# Patient Record
Sex: Female | Born: 1965 | Race: White | Hispanic: Yes | Marital: Married | Smoking: Never smoker
Health system: Southern US, Community
[De-identification: ages and names within clinical notes are randomized; demographics above are authoritative.]

---

## 2001-03-11 ENCOUNTER — Encounter: Payer: Self-pay | Admitting: Emergency Medicine

## 2001-03-11 ENCOUNTER — Emergency Department (HOSPITAL_COMMUNITY): Admission: AC | Admit: 2001-03-11 | Discharge: 2001-03-11 | Payer: Self-pay

## 2001-04-04 ENCOUNTER — Emergency Department (HOSPITAL_COMMUNITY): Admission: EM | Admit: 2001-04-04 | Discharge: 2001-04-04 | Payer: Self-pay | Admitting: Emergency Medicine

## 2001-06-09 ENCOUNTER — Emergency Department (HOSPITAL_COMMUNITY): Admission: EM | Admit: 2001-06-09 | Discharge: 2001-06-09 | Payer: Self-pay

## 2001-07-06 ENCOUNTER — Emergency Department (HOSPITAL_COMMUNITY): Admission: EM | Admit: 2001-07-06 | Discharge: 2001-07-07 | Payer: Self-pay | Admitting: Emergency Medicine

## 2001-10-20 ENCOUNTER — Emergency Department (HOSPITAL_COMMUNITY): Admission: EM | Admit: 2001-10-20 | Discharge: 2001-10-20 | Payer: Self-pay | Admitting: *Deleted

## 2001-10-20 ENCOUNTER — Encounter: Payer: Self-pay | Admitting: *Deleted

## 2002-12-06 ENCOUNTER — Emergency Department (HOSPITAL_COMMUNITY): Admission: EM | Admit: 2002-12-06 | Discharge: 2002-12-06 | Payer: Self-pay | Admitting: Emergency Medicine

## 2003-03-04 ENCOUNTER — Emergency Department (HOSPITAL_COMMUNITY): Admission: EM | Admit: 2003-03-04 | Discharge: 2003-03-04 | Payer: Self-pay | Admitting: Emergency Medicine

## 2004-09-08 ENCOUNTER — Emergency Department (HOSPITAL_COMMUNITY): Admission: EM | Admit: 2004-09-08 | Discharge: 2004-09-08 | Payer: Self-pay | Admitting: *Deleted

## 2005-07-28 ENCOUNTER — Emergency Department (HOSPITAL_COMMUNITY): Admission: EM | Admit: 2005-07-28 | Discharge: 2005-07-28 | Payer: Self-pay | Admitting: Emergency Medicine

## 2007-04-08 ENCOUNTER — Observation Stay (HOSPITAL_COMMUNITY): Admission: EM | Admit: 2007-04-08 | Discharge: 2007-04-09 | Payer: Self-pay | Admitting: Emergency Medicine

## 2007-06-14 ENCOUNTER — Other Ambulatory Visit: Admission: RE | Admit: 2007-06-14 | Discharge: 2007-06-14 | Payer: Self-pay | Admitting: Obstetrics and Gynecology

## 2007-07-04 ENCOUNTER — Emergency Department (HOSPITAL_COMMUNITY): Admission: EM | Admit: 2007-07-04 | Discharge: 2007-07-04 | Payer: Self-pay | Admitting: Emergency Medicine

## 2007-09-01 ENCOUNTER — Inpatient Hospital Stay (HOSPITAL_COMMUNITY): Admission: AD | Admit: 2007-09-01 | Discharge: 2007-09-01 | Payer: Self-pay | Admitting: Family Medicine

## 2007-09-26 ENCOUNTER — Ambulatory Visit: Payer: Self-pay | Admitting: Obstetrics and Gynecology

## 2007-09-26 ENCOUNTER — Inpatient Hospital Stay (HOSPITAL_COMMUNITY): Admission: AD | Admit: 2007-09-26 | Discharge: 2007-09-26 | Payer: Self-pay | Admitting: Obstetrics & Gynecology

## 2007-10-09 ENCOUNTER — Ambulatory Visit: Payer: Self-pay | Admitting: Obstetrics & Gynecology

## 2007-10-09 ENCOUNTER — Inpatient Hospital Stay (HOSPITAL_COMMUNITY): Admission: AD | Admit: 2007-10-09 | Discharge: 2007-10-11 | Payer: Self-pay | Admitting: Obstetrics and Gynecology

## 2009-05-06 ENCOUNTER — Emergency Department (HOSPITAL_COMMUNITY): Admission: EM | Admit: 2009-05-06 | Discharge: 2009-05-06 | Payer: Self-pay | Admitting: Emergency Medicine

## 2009-08-31 ENCOUNTER — Emergency Department (HOSPITAL_COMMUNITY): Admission: EM | Admit: 2009-08-31 | Discharge: 2009-08-31 | Payer: Self-pay | Admitting: Emergency Medicine

## 2009-12-19 ENCOUNTER — Ambulatory Visit (HOSPITAL_COMMUNITY): Admission: RE | Admit: 2009-12-19 | Discharge: 2009-12-19 | Payer: Self-pay | Admitting: Family Medicine

## 2010-06-11 LAB — POCT I-STAT, CHEM 8
BUN: 15 mg/dL (ref 6–23)
Calcium, Ion: 1.06 mmol/L — ABNORMAL LOW (ref 1.12–1.32)
Chloride: 110 mEq/L (ref 96–112)
Creatinine, Ser: 0.5 mg/dL (ref 0.4–1.2)
Glucose, Bld: 108 mg/dL — ABNORMAL HIGH (ref 70–99)
HCT: 28 % — ABNORMAL LOW (ref 36.0–46.0)
Hemoglobin: 9.5 g/dL — ABNORMAL LOW (ref 12.0–15.0)
Potassium: 3.7 mEq/L (ref 3.5–5.1)
Sodium: 137 mEq/L (ref 135–145)
TCO2: 19 mmol/L (ref 0–100)

## 2010-06-28 ENCOUNTER — Emergency Department (HOSPITAL_COMMUNITY): Payer: Self-pay

## 2010-06-28 ENCOUNTER — Encounter (HOSPITAL_COMMUNITY): Payer: Self-pay

## 2010-06-28 ENCOUNTER — Emergency Department (HOSPITAL_COMMUNITY)
Admission: EM | Admit: 2010-06-28 | Discharge: 2010-06-28 | Disposition: A | Discharge status: Home / Self Care | Payer: Self-pay | Attending: Emergency Medicine | Admitting: Emergency Medicine

## 2010-06-28 DIAGNOSIS — IMO0001 Reserved for inherently not codable concepts without codable children: Secondary | ICD-10-CM | POA: Insufficient documentation

## 2010-06-28 DIAGNOSIS — J984 Other disorders of lung: Secondary | ICD-10-CM | POA: Insufficient documentation

## 2010-06-28 DIAGNOSIS — R059 Cough, unspecified: Secondary | ICD-10-CM | POA: Insufficient documentation

## 2010-06-28 DIAGNOSIS — E860 Dehydration: Secondary | ICD-10-CM | POA: Insufficient documentation

## 2010-06-28 DIAGNOSIS — M549 Dorsalgia, unspecified: Secondary | ICD-10-CM | POA: Insufficient documentation

## 2010-06-28 DIAGNOSIS — R51 Headache: Secondary | ICD-10-CM | POA: Insufficient documentation

## 2010-06-28 DIAGNOSIS — B9789 Other viral agents as the cause of diseases classified elsewhere: Secondary | ICD-10-CM | POA: Insufficient documentation

## 2010-06-28 DIAGNOSIS — R05 Cough: Secondary | ICD-10-CM | POA: Insufficient documentation

## 2010-06-28 DIAGNOSIS — R110 Nausea: Secondary | ICD-10-CM | POA: Insufficient documentation

## 2010-06-28 LAB — URINALYSIS, ROUTINE W REFLEX MICROSCOPIC
Glucose, UA: NEGATIVE mg/dL
Hgb urine dipstick: NEGATIVE
Ketones, ur: 15 mg/dL — AB
Nitrite: NEGATIVE
Protein, ur: NEGATIVE mg/dL
Specific Gravity, Urine: 1.025 (ref 1.005–1.030)
Urobilinogen, UA: 1 mg/dL (ref 0.0–1.0)
pH: 5.5 (ref 5.0–8.0)

## 2010-06-28 LAB — CBC
HCT: 36.8 % (ref 36.0–46.0)
Hemoglobin: 11.9 g/dL — ABNORMAL LOW (ref 12.0–15.0)
MCH: 29 pg (ref 26.0–34.0)
MCHC: 32.3 g/dL (ref 30.0–36.0)
MCV: 89.8 fL (ref 78.0–100.0)
Platelets: 332 10*3/uL (ref 150–400)
RBC: 4.1 MIL/uL (ref 3.87–5.11)
RDW: 15.1 % (ref 11.5–15.5)
WBC: 10.4 10*3/uL (ref 4.0–10.5)

## 2010-06-28 LAB — COMPREHENSIVE METABOLIC PANEL
ALT: 84 U/L — ABNORMAL HIGH (ref 0–35)
AST: 35 U/L (ref 0–37)
Albumin: 3.4 g/dL — ABNORMAL LOW (ref 3.5–5.2)
Alkaline Phosphatase: 234 U/L — ABNORMAL HIGH (ref 39–117)
BUN: 10 mg/dL (ref 6–23)
CO2: 21 mEq/L (ref 19–32)
Calcium: 8.7 mg/dL (ref 8.4–10.5)
Chloride: 106 mEq/L (ref 96–112)
Creatinine, Ser: 0.5 mg/dL (ref 0.4–1.2)
GFR calc Af Amer: >60 mL/min (ref >60)
GFR calc non Af Amer: >60 mL/min (ref >60)
Glucose, Bld: 103 mg/dL — ABNORMAL HIGH (ref 70–99)
Potassium: 3.8 mEq/L (ref 3.5–5.1)
Sodium: 136 mEq/L (ref 135–145)
Total Bilirubin: 0.5 mg/dL (ref 0.3–1.2)
Total Protein: 7.3 g/dL (ref 6.0–8.3)

## 2010-06-28 LAB — DIFFERENTIAL
Basophils Absolute: 0 10*3/uL (ref 0.0–0.1)
Basophils Relative: 0 % (ref 0–1)
Eosinophils Absolute: 0.2 10*3/uL (ref 0.0–0.7)
Eosinophils Relative: 2 % (ref 0–5)
Lymphocytes Relative: 19 % (ref 12–46)
Lymphs Abs: 2 10*3/uL (ref 0.7–4.0)
Monocytes Absolute: 0.7 10*3/uL (ref 0.1–1.0)
Monocytes Relative: 6 % (ref 3–12)
Neutro Abs: 7.6 10*3/uL (ref 1.7–7.7)
Neutrophils Relative %: 73 % (ref 43–77)

## 2010-06-28 LAB — URINE MICROSCOPIC-ADD ON

## 2010-06-28 LAB — POCT CARDIAC MARKERS
CKMB, poc: <1 ng/mL — ABNORMAL LOW (ref 1.0–8.0)
Myoglobin, poc: 39.6 ng/mL (ref 12–200)
Troponin i, poc: <0.05 ng/mL (ref 0.00–0.09)

## 2010-06-28 LAB — POCT PREGNANCY, URINE: Preg Test, Ur: NEGATIVE

## 2010-06-28 MED ORDER — IOHEXOL 300 MG/ML  SOLN
80.0000 mL | Freq: Once | INTRAMUSCULAR | Status: AC | PRN
  Administered 2010-06-28: 80 mL via INTRAVENOUS

## 2010-06-30 LAB — URINE CULTURE
Colony Count: NO GROWTH
Culture  Setup Time: 201204090927
Culture: NO GROWTH

## 2010-08-04 NOTE — H&P (Signed)
NAMENEWELL, FRATER               ACCOUNT NO.:  [id number]   MEDICAL RECORD NO.:  [id number]          PATIENT TYPE:  OBV   LOCATION:  A338                          FACILITY:  [facility]   PHYSICIAN:  Tilford Pillar, MD      DATE OF BIRTH:  1965-07-28   DATE OF ADMISSION:  04/07/2007  DATE OF DISCHARGE:  LH                              HISTORY & PHYSICAL   CHIEF COMPLAINT:  Abdominal pain.   HISTORY OF PRESENT ILLNESS:  The patient is a 45 year old Hispanic  female who is approximately 17 weeks gravid who presents with  approximately 2 days of right-sided abdominal pain.  She is Spanish  speaking but does speak some English, however, during obtaining the  history and physical the phone interpreter patient service was utilized.  Apparently this abdominal pain came on insidiously.  Has always been  right-sided with no radiation.  She has had some associated nausea with  it, only a couple episodes of emesis.  These have improved.  She has had  no hematemesis.  She states that the pain was worse with walking and no  revealing features were noted.  She has denied any change with bowel  movements.  No hematochezia, no melena.  She has had some dysuria with  foul-smelling urine but no hematuria.  She has had no similar pain in  the past although during the conversation she has stated that her pain  symptoms have improved significantly since her admission.  She denies  any current fever or chills.  No shortness of breath.  No chest pain.  She denies any vaginal discharge.  No vaginal bleeding.  It is noted  that her last OB/GYN appointment was in July.  She has not seen an OB  for any prenatal or perinatal care.   PAST MEDICAL HISTORY:  None.   PAST SURGICAL HISTORY:  None.   CURRENT MEDICATIONS:  No current medications.   ALLERGIES:  No known drug allergies.   SOCIAL HISTORY:  No tobacco use, no alcohol use, no recreational drug  use.   REVIEW OF SYSTEMS:  CONSTITUTIONAL:  Unremarkable.   EYES:  Unremarkable.  EARS, NOSE AND THROAT:  Unremarkable.  PULMONARY:  Unremarkable.  CARDIOVASCULAR:  Unremarkable.  GASTROINTESTINAL:  As per HPI, otherwise  unremarkable.  GENITOURINARY:  The dysuria and foul-smelling urine per  HPI, otherwise unremarkable.  ENDOCRINE:  Unremarkable.  MUSCULOSKELETAL:  Unremarkable.   PHYSICAL EXAMINATION:  VITAL SIGNS:  Temperature 99.3, heart rate 83,  respirations 18, blood pressure 104/59.  GENERAL:  She is lying in a supine position in her hospital bed in no  acute distress.  Family is present in the room.  HEENT:  Pupils are equal, round and reactive.  Extraocular movements are  intact.  No conjunctival pallor is noted.  Trachea is midline.  No  cervical lymphadenopathy is appreciated.  PULMONARY:  Unlabored respirations.  She is clear to auscultation  bilaterally.  CARDIOVASCULAR:  Regular rate and rhythm.  No murmurs or gallops are  appreciated.  ABDOMEN:  She does have bowel sounds present.  Abdomen is soft and  gravid on palpation.  She does have right-sided mild to moderate  tenderness to palpation.  No rebound or peritoneal signs are elicited.  No herniations are apparent.  EXTREMITIES:  Warm and dry.   PERTINENT LABORATORY AND RADIOGRAPHIC STUDIES:  CBC, white blood cell  count 13.6 down from 15.0 in the emergency department.  Hemoglobin 9.7,  hematocrit 34.0, platelets 228.  Basic metabolic panel, sodium 135,  potassium 3.6, chloride 110, bicarb 21, BUN 5, creatinine 0.43, glucose  101.  UA obtained in the emergency department is negative.  No blood.  No white blood cells, no bacteria.  CT evaluation of the abdomen and  pelvis, no free air or free fluid is demonstrated.  She does have a  slightly widened appendix measuring 9 mm which is slightly increased  from normal caliber.  No stranding or inflammatory changes are noted in  the pericecal region.   ASSESSMENT AND PLAN:  Abdominal pain.  At this point suspicion is low  for  acute appendicitis although this is still a possibility but early  appendicitis.  This was discussed with the patient.  Also noted is the  patient does have an appetite and feels hungry which is not consistent  with an acute appendicitis.  Her white blood cell count has decreased  without any antibiotic treatment, with just IV fluid hydration, and  would be in a consistent with a normal pregnancy.  Additionally she has  not had any fevers.  She is not tachycardic.  Her abdominal pain has  improved, again with just IV fluid hydration.  She is not currently on  any pain medications and will remain off of pain medications so that any  change in her symptomatology can be detected early.  This was discussed  with the patient as well as the continuation of an n.p.o. status.  The  risk of appendicitis in the presence of a pregnancy as well as the risks  of having an operation in the second trimester of her pregnancy were  discussed with the patient. At this point should any of her  symptomatology or blood work or clinical presentation worsen suggesting  progression of appendicitis then a surgical intervention will be  pursued.  However, at this point continued IV fluid hydration and close  monitoring will be conducted.  Additionally, no matter which direction  the patient's course may proceed it is recommended that she get  obstetric care for her pregnancy.      Tilford Pillar, MD  Electronically Signed     BZ/MEDQ  D:  04/08/2007  T:  04/08/2007  Job:  [id number]

## 2010-12-10 LAB — URINALYSIS, ROUTINE W REFLEX MICROSCOPIC
Bilirubin Urine: NEGATIVE
Glucose, UA: NEGATIVE
Hgb urine dipstick: NEGATIVE
Ketones, ur: NEGATIVE
Nitrite: NEGATIVE
Protein, ur: NEGATIVE
Specific Gravity, Urine: <1.005 — ABNORMAL LOW
Urobilinogen, UA: 0.2
pH: 6

## 2010-12-10 LAB — CBC
HCT: 30 — ABNORMAL LOW
HCT: 30.8 — ABNORMAL LOW
Hemoglobin: 10.1 — ABNORMAL LOW
Hemoglobin: 9.7 — ABNORMAL LOW
Hemoglobin: 9.9 — ABNORMAL LOW
MCHC: 32.1
MCHC: 32.4
MCV: 75.1 — ABNORMAL LOW
MCV: 75.4 — ABNORMAL LOW
Platelets: 228
Platelets: 242
Platelets: 248
RBC: 3.98
RBC: 4.09
RDW: 19.7 — ABNORMAL HIGH
RDW: 19.8 — ABNORMAL HIGH
RDW: 20.5 — ABNORMAL HIGH
WBC: 13.6 — ABNORMAL HIGH
WBC: 15 — ABNORMAL HIGH
WBC: 9.4

## 2010-12-10 LAB — DIFFERENTIAL
Basophils Absolute: 0
Basophils Absolute: 0
Basophils Absolute: 0.1
Basophils Relative: 0
Basophils Relative: 0
Eosinophils Absolute: 0
Eosinophils Absolute: 0.1
Eosinophils Relative: 0
Eosinophils Relative: 1
Lymphocytes Relative: 16
Lymphocytes Relative: 17
Lymphocytes Relative: 23
Lymphs Abs: 2.2
Lymphs Abs: 2.2
Lymphs Abs: 2.5
Monocytes Absolute: 0.6
Monocytes Absolute: 0.6
Monocytes Absolute: 0.8
Monocytes Relative: 5
Monocytes Relative: 6
Neutro Abs: 10.7 — ABNORMAL HIGH
Neutro Abs: 11.5 — ABNORMAL HIGH
Neutro Abs: 6.6
Neutrophils Relative %: 77
Neutrophils Relative %: 79 — ABNORMAL HIGH

## 2010-12-10 LAB — BASIC METABOLIC PANEL
BUN: 5 — ABNORMAL LOW
CO2: 21
Calcium: 8.3 — ABNORMAL LOW
Chloride: 110
Creatinine, Ser: 0.43
GFR calc Af Amer: >60
GFR calc non Af Amer: >60
Glucose, Bld: 101 — ABNORMAL HIGH
Potassium: 3.6
Sodium: 135

## 2010-12-15 LAB — DIFFERENTIAL
Eosinophils Absolute: 0.3
Eosinophils Relative: 3
Lymphs Abs: 2.6
Monocytes Absolute: 0.9
Monocytes Relative: 8

## 2010-12-15 LAB — URINALYSIS, ROUTINE W REFLEX MICROSCOPIC
Bilirubin Urine: NEGATIVE
Glucose, UA: NEGATIVE
Ketones, ur: NEGATIVE
Protein, ur: NEGATIVE

## 2010-12-15 LAB — BASIC METABOLIC PANEL
CO2: 23
Chloride: 107
GFR calc Af Amer: >60
Potassium: 3.9

## 2010-12-15 LAB — CBC
HCT: 34 — ABNORMAL LOW
MCV: 90.5
RBC: 3.76 — ABNORMAL LOW
WBC: 11.3 — ABNORMAL HIGH

## 2010-12-15 LAB — URINE MICROSCOPIC-ADD ON

## 2010-12-18 LAB — CBC
HCT: 36.2
MCV: 93.6
RBC: 3.86 — ABNORMAL LOW
WBC: 11.1 — ABNORMAL HIGH

## 2010-12-18 LAB — RPR: RPR Ser Ql: NONREACTIVE

## 2011-04-15 ENCOUNTER — Encounter (HOSPITAL_COMMUNITY): Payer: Self-pay | Admitting: *Deleted

## 2011-04-15 ENCOUNTER — Emergency Department (HOSPITAL_COMMUNITY)
Admission: EM | Admit: 2011-04-15 | Discharge: 2011-04-15 | Disposition: A | Discharge status: Home / Self Care | Payer: Self-pay | Attending: Emergency Medicine | Admitting: Emergency Medicine

## 2011-04-15 DIAGNOSIS — R21 Rash and other nonspecific skin eruption: Secondary | ICD-10-CM | POA: Insufficient documentation

## 2011-04-15 DIAGNOSIS — R1012 Left upper quadrant pain: Secondary | ICD-10-CM | POA: Insufficient documentation

## 2011-04-15 DIAGNOSIS — L52 Erythema nodosum: Secondary | ICD-10-CM | POA: Insufficient documentation

## 2011-04-15 LAB — COMPREHENSIVE METABOLIC PANEL
ALT: 18 U/L (ref 0–35)
Alkaline Phosphatase: 79 U/L (ref 39–117)
CO2: 26 mEq/L (ref 19–32)
Chloride: 104 mEq/L (ref 96–112)
GFR calc Af Amer: >90 mL/min (ref >90)
GFR calc non Af Amer: >90 mL/min (ref >90)
Glucose, Bld: 103 mg/dL — ABNORMAL HIGH (ref 70–99)
Potassium: 4.6 mEq/L (ref 3.5–5.1)
Sodium: 138 mEq/L (ref 135–145)

## 2011-04-15 LAB — DIFFERENTIAL
Lymphocytes Relative: 25 % (ref 12–46)
Lymphs Abs: 2.1 10*3/uL (ref 0.7–4.0)
Neutro Abs: 5.7 10*3/uL (ref 1.7–7.7)
Neutrophils Relative %: 65 % (ref 43–77)

## 2011-04-15 LAB — CBC
MCV: 83.1 fL (ref 78.0–100.0)
Platelets: 283 10*3/uL (ref 150–400)
RBC: 4.14 MIL/uL (ref 3.87–5.11)
WBC: 8.8 10*3/uL (ref 4.0–10.5)

## 2011-04-15 LAB — URINALYSIS, ROUTINE W REFLEX MICROSCOPIC
Hgb urine dipstick: NEGATIVE
Specific Gravity, Urine: 1.019 (ref 1.005–1.030)
Urobilinogen, UA: 0.2 mg/dL (ref 0.0–1.0)

## 2011-04-15 MED ORDER — DIPHENHYDRAMINE HCL 50 MG/ML IJ SOLN
25.0000 mg | Freq: Once | INTRAMUSCULAR | Status: AC
  Administered 2011-04-15: 25 mg via INTRAVENOUS
  Filled 2011-04-15: qty 1

## 2011-04-15 MED ORDER — PREDNISONE 20 MG PO TABS
60.0000 mg | ORAL_TABLET | ORAL | Status: AC
  Administered 2011-04-15: 60 mg via ORAL
  Filled 2011-04-15: qty 3

## 2011-04-15 MED ORDER — SODIUM CHLORIDE 0.9 % IV BOLUS (SEPSIS)
1000.0000 mL | Freq: Once | INTRAVENOUS | Status: AC
  Administered 2011-04-15: 1000 mL via INTRAVENOUS

## 2011-04-15 NOTE — ED Provider Notes (Signed)
History     CSN: [id number]  Arrival date & time 04/15/11  1712   First MD Initiated Contact with Patient 04/15/11 1814      Chief Complaint  Patient presents with  . Rash  . Abdominal Pain    (Consider location/radiation/quality/duration/timing/severity/associated sxs/prior treatment) HPI The patient presents with rash and left sided abdominal pain.  She notes that the rash began gradually, 3 days ago.  Since onset it has expanded to encompass both lower extremities in her entirety.  The rash is patchy, erythematous, not painful, pruritic.  No recollection of particular precipitants, no alleviating or exacerbating factors.  Yesterday the patient also developed abdominal pain.  The pain is focally about the left lateral abdomen.  Pain is nonradiating, sharp.  No clear exacerbating or alleviating factors for the pain either.  The patient denies any significant: Current nausea, vomiting, diarrhea, dysuria, other abdominal pain, chest pain, dyspnea. History reviewed. No pertinent past medical history.  History reviewed. No pertinent past surgical history.  No family history on file.  History  Substance Use Topics  . Smoking status: Never Smoker   . Smokeless tobacco: Not on file  . Alcohol Use: No    OB History    Grav Para Term Preterm Abortions TAB SAB Ect Mult Living                  Review of Systems  Constitutional:       HPI  HENT:       HPI otherwise negative  Eyes: Negative.   Respiratory:       HPI, otherwise negative  Cardiovascular:       HPI, otherwise nmegative  Gastrointestinal: Negative for vomiting.  Genitourinary:       HPI, otherwise negative  Musculoskeletal:       HPI, otherwise negative  Skin: Positive for rash.  Neurological: Negative for syncope.    Allergies  Review of patient's allergies indicates no known allergies.  Home Medications  No current outpatient prescriptions on file.  BP 111/64  Pulse 83  Temp(Src) 98.5 F (36.9 C)  (Oral)  Resp 16  SpO2 97%  Physical Exam  Nursing note and vitals reviewed. Constitutional: She is oriented to person, place, and time. She appears well-developed and well-nourished. No distress.  HENT:  Head: Normocephalic and atraumatic.  Eyes: Conjunctivae and EOM are normal. Pupils are equal, round, and reactive to light.  Cardiovascular: Normal rate and regular rhythm.   Pulmonary/Chest: Effort normal. No stridor. She has no wheezes. She has no rales.  Abdominal: Soft. There is no hepatosplenomegaly. There is tenderness in the left upper quadrant. There is no CVA tenderness. No hernia.  Musculoskeletal: She exhibits no edema and no tenderness.  Neurological: She is alert and oriented to person, place, and time. No cranial nerve deficit. Coordination normal.  Skin: She is not diaphoretic.       Diffuse minimally raised erythematous patches consistent with erythema nodosum across both lower extremities .    ED Course  Procedures (including critical care time)  Labs Reviewed  CBC - Abnormal; Notable for the following:    Hemoglobin 11.2 (*)    HCT 34.4 (*)    RDW 16.1 (*)    All other components within normal limits  COMPREHENSIVE METABOLIC PANEL - Abnormal; Notable for the following:    Glucose, Bld 103 (*)    All other components within normal limits  URINALYSIS, ROUTINE W REFLEX MICROSCOPIC  DIFFERENTIAL  POCT PREGNANCY, URINE  POCT  PREGNANCY, URINE   No results found.   No diagnosis found.    MDM  This previously well 46 year old female now presents with several days of rash and new abdominal pain.  On exam she is in no distress.  She has a rash consistent with erythema nodosum.  Given the patient's nutrition abdominal pain there is some concern for ongoing GI infection, though the patient's absence of distress, diarrhea, vomiting is reassuring.  Similarly reassuring is the patient's absence of fever, continued by mouth tolerance and the absence of leukocytosis.  The  patient we discharged in stable condition to follow up with her primary care physician.        Gerhard Munch, MD 04/15/11 2035

## 2011-04-15 NOTE — ED Notes (Signed)
Patient complains of rash and itching x 3 days. Rates pain as 8/10 for itching and discomfort.

## 2011-04-15 NOTE — ED Notes (Signed)
Patient ambulatory to bathroom several times without difficulty.  Denies itching and discomfort

## 2011-04-15 NOTE — ED Notes (Signed)
Pt is here for evaluation of red rash and Left sided abdominal pain.  Pt has had the rash for 3 days and the abdominal pain for 2 days

## 2011-04-15 NOTE — ED Notes (Signed)
Patient left ambulatory with family. Denies pain, itching or further concerns.  Understanding of d/c instruction

## 2012-05-15 ENCOUNTER — Encounter (HOSPITAL_COMMUNITY): Payer: Self-pay | Admitting: *Deleted

## 2012-05-15 ENCOUNTER — Emergency Department (HOSPITAL_COMMUNITY)
Admission: EM | Admit: 2012-05-15 | Discharge: 2012-05-16 | Disposition: A | Discharge status: Home / Self Care | Payer: Self-pay | Attending: Emergency Medicine | Admitting: Emergency Medicine

## 2012-05-15 DIAGNOSIS — Z3202 Encounter for pregnancy test, result negative: Secondary | ICD-10-CM | POA: Insufficient documentation

## 2012-05-15 DIAGNOSIS — R300 Dysuria: Secondary | ICD-10-CM | POA: Insufficient documentation

## 2012-05-15 DIAGNOSIS — Z79899 Other long term (current) drug therapy: Secondary | ICD-10-CM | POA: Insufficient documentation

## 2012-05-15 DIAGNOSIS — N390 Urinary tract infection, site not specified: Secondary | ICD-10-CM | POA: Insufficient documentation

## 2012-05-15 DIAGNOSIS — R109 Unspecified abdominal pain: Secondary | ICD-10-CM | POA: Insufficient documentation

## 2012-05-15 LAB — WET PREP, GENITAL: Trich, Wet Prep: NONE SEEN

## 2012-05-15 NOTE — ED Provider Notes (Signed)
History     CSN: [id number]  Arrival date & time 05/15/12  2156   First MD Initiated Contact with Patient 05/15/12 2230      Chief Complaint  Patient presents with  . Vaginal Itching    (Consider location/radiation/quality/duration/timing/severity/associated sxs/prior treatment) HPI Comments: Patient is a 47 year old female who presents with a 2 day history of vaginal itching. Patient reports symptoms started gradually and progressively worsened since the onset. Patient reports associated dysuria. LMP last week. Symptoms worse with urination. Patient has not tried anything for symptoms. No alleviating factors.   Patient is a 46 y.o. female presenting with vaginal itching.  Vaginal Itching    History reviewed. No pertinent past medical history.  History reviewed. No pertinent past surgical history.  History reviewed. No pertinent family history.  History  Substance Use Topics  . Smoking status: Never Smoker   . Smokeless tobacco: Not on file  . Alcohol Use: No    OB History   Grav Para Term Preterm Abortions TAB SAB Ect Mult Living                  Review of Systems  Genitourinary:       Vaginal itching  All other systems reviewed and are negative.    Allergies  Review of patient's allergies indicates no known allergies.  Home Medications   Current Outpatient Rx  Name  Route  Sig  Dispense  Refill  . acetaminophen (TYLENOL) 500 MG tablet   Oral   Take 1,000 mg by mouth every 6 (six) hours as needed for pain.         . cetirizine (ZYRTEC) 10 MG tablet   Oral   Take 10 mg by mouth daily.         Marland Kitchen ibuprofen (ADVIL,MOTRIN) 200 MG tablet   Oral   Take 400 mg by mouth every 6 (six) hours as needed for pain.           BP 120/52  Pulse 81  Temp(Src) 98.2 F (36.8 C) (Oral)  Resp 18  SpO2 100%  LMP 05/08/2012  Physical Exam  Nursing note and vitals reviewed. Constitutional: She is oriented to person, place, and time. She appears  well-developed and well-nourished. No distress.  HENT:  Head: Normocephalic and atraumatic.  Eyes: Conjunctivae and EOM are normal.  Neck: Normal range of motion.  Cardiovascular: Normal rate and regular rhythm.  Exam reveals no gallop and no friction rub.   No murmur heard. Pulmonary/Chest: Effort normal and breath sounds normal. She has no wheezes. She has no rales. She exhibits no tenderness.  Abdominal: Soft. She exhibits no distension. There is tenderness. There is no rebound and no guarding.  Mild lower abdominal tenderness. No peritoneal signs.   Genitourinary:  Thick, white discharge noted. No CMT. No abnormal masses or adnexal tenderness noted on bimanual exam.   Musculoskeletal: Normal range of motion.  Neurological: She is alert and oriented to person, place, and time. Coordination normal.  Speech is goal-oriented. Moves limbs without ataxia.   Skin: Skin is warm and dry.  Psychiatric: She has a normal mood and affect. Her behavior is normal.    ED Course  Procedures (including critical care time)  Labs Reviewed  WET PREP, GENITAL - Abnormal; Notable for the following:    Clue Cells Wet Prep HPF POC FEW (*)    WBC, Wet Prep HPF POC MANY (*)    All other components within normal limits  URINALYSIS, ROUTINE  W REFLEX MICROSCOPIC - Abnormal; Notable for the following:    APPearance TURBID (*)    Protein, ur 100 (*)    Leukocytes, UA LARGE (*)    All other components within normal limits  URINE MICROSCOPIC-ADD ON - Abnormal; Notable for the following:    Squamous Epithelial / LPF FEW (*)    Bacteria, UA FEW (*)    All other components within normal limits  GC/CHLAMYDIA PROBE AMP  URINE CULTURE  POCT PREGNANCY, URINE   No results found.   1. UTI (lower urinary tract infection)       MDM  10:49 PM Patient will have pelvic. Urinalysis pending.   12:02 AM Urinalysis not sent to main lab. Patient signed out to [facility] [place] for disposition.         Emilia Beck, [place] 05/16/12 [phone]

## 2012-05-15 NOTE — ED Notes (Signed)
Pt c/o vaginal itching and pain when urinating.  No vaginal bleeding.  Denies fever, n/v.

## 2012-05-16 LAB — URINALYSIS, ROUTINE W REFLEX MICROSCOPIC
Bilirubin Urine: NEGATIVE
Glucose, UA: NEGATIVE mg/dL
Hgb urine dipstick: NEGATIVE
Ketones, ur: NEGATIVE mg/dL
Nitrite: NEGATIVE
Protein, ur: 100 mg/dL — AB
Specific Gravity, Urine: 1.025 (ref 1.005–1.030)
Urobilinogen, UA: 1 mg/dL (ref 0.0–1.0)
pH: 7 (ref 5.0–8.0)

## 2012-05-16 LAB — URINE MICROSCOPIC-ADD ON

## 2012-05-16 MED ORDER — NAPROXEN 500 MG PO TABS: 500.0000 mg | ORAL_TABLET | Freq: Two times a day (BID) | ORAL | Status: DC

## 2012-05-16 MED ORDER — DEXTROSE 5 % IV SOLN
1.0000 g | Freq: Once | INTRAVENOUS | Status: AC
  Administered 2012-05-16: 1 g via INTRAVENOUS
  Filled 2012-05-16: qty 10

## 2012-05-16 MED ORDER — KETOROLAC TROMETHAMINE 30 MG/ML IJ SOLN
30.0000 mg | Freq: Once | INTRAMUSCULAR | Status: AC
  Administered 2012-05-16: 30 mg via INTRAVENOUS
  Filled 2012-05-16: qty 1

## 2012-05-16 MED ORDER — SODIUM CHLORIDE 0.9 % IV BOLUS (SEPSIS)
1000.0000 mL | Freq: Once | INTRAVENOUS | Status: AC
  Administered 2012-05-16: 1000 mL via INTRAVENOUS

## 2012-05-16 MED ORDER — CEPHALEXIN 500 MG PO CAPS: 500.0000 mg | ORAL_CAPSULE | Freq: Two times a day (BID) | ORAL | Status: DC

## 2012-05-16 NOTE — ED Provider Notes (Signed)
Pt care resumed from [facility]. Pt to ER c/o vaginal itching & dysuria. Pt is sexually active with her husband and does not use protection. Pelvic exam with thick white dc, but no cervical motion tenderness. Wet Prep w Many WBCs & few clue cells. Pt has GC/Chlamydia cultures pending and disposition pending UA. Pt re-evaluated & on exam: hemodynamically stable, NAD, heart w/ RRR, lungs CTAB, Chest & abd non-tender, no peripheral edema or calf tenderness.  12:47 AM  Pt w TNTC WBCs, large leukocytes and proteinuria. Will tx w IVFs, rocephin, and Toradol. Pt to be dc with keflex. Vaginal cultures pending. Pt has been diagnosed with a UTI. Pt is afebrile, no CVA tenderness, normotensive, and denies N/V. Pt to be dc home with antibiotics and instructions to follow up with PCP if symptoms persist.  At this time there does not appear to be any evidence of an acute emergency medical condition and the patient appears stable for discharge with appropriate outpatient follow up.Diagnosis was discussed with patient who verbalizes understanding and is agreeable to discharge.      Jaci Carrel, [place] 05/16/12 [phone]

## 2012-05-16 NOTE — ED Provider Notes (Signed)
  Medical screening examination/treatment/procedure(s) were performed by non-physician practitioner and as supervising physician I was immediately available for consultation/collaboration.    Clinton Dragone, MD 05/16/12 2321 

## 2012-05-16 NOTE — ED Provider Notes (Signed)
  Medical screening examination/treatment/procedure(s) were performed by non-physician practitioner and as supervising physician I was immediately available for consultation/collaboration.    Gerhard Munch, MD 05/16/12 [phone]

## 2012-05-17 LAB — URINE CULTURE: Colony Count: 9000

## 2012-05-17 LAB — GC/CHLAMYDIA PROBE AMP
CT Probe RNA: NEGATIVE
GC Probe RNA: NEGATIVE

## 2012-10-04 ENCOUNTER — Other Ambulatory Visit (HOSPITAL_COMMUNITY): Payer: Self-pay | Admitting: Nurse Practitioner

## 2012-10-04 DIAGNOSIS — Z139 Encounter for screening, unspecified: Secondary | ICD-10-CM

## 2012-10-11 ENCOUNTER — Ambulatory Visit (HOSPITAL_COMMUNITY)
Admission: RE | Admit: 2012-10-11 | Discharge: 2012-10-11 | Disposition: A | Discharge status: Home / Self Care | Payer: Self-pay | Source: Ambulatory Visit | Attending: Nurse Practitioner | Admitting: Nurse Practitioner

## 2012-10-11 DIAGNOSIS — Z139 Encounter for screening, unspecified: Secondary | ICD-10-CM

## 2013-09-25 ENCOUNTER — Emergency Department (HOSPITAL_COMMUNITY)
Admission: EM | Admit: 2013-09-25 | Discharge: 2013-09-25 | Disposition: A | Discharge status: Home / Self Care | Payer: Self-pay | Attending: Emergency Medicine | Admitting: Emergency Medicine

## 2013-09-25 ENCOUNTER — Encounter (HOSPITAL_COMMUNITY): Payer: Self-pay | Admitting: Emergency Medicine

## 2013-09-25 DIAGNOSIS — M26609 Unspecified temporomandibular joint disorder, unspecified side: Secondary | ICD-10-CM | POA: Insufficient documentation

## 2013-09-25 MED ORDER — METHOCARBAMOL 500 MG PO TABS: 1000.0000 mg | ORAL_TABLET | Freq: Four times a day (QID) | ORAL | Status: AC | PRN

## 2013-09-25 NOTE — ED Provider Notes (Signed)
CSN: [id number]     Arrival date & time 09/25/13  1737 History  This chart was scribed for non-physician practitioner, Wynetta EmeryNicole Julliana Whitmyer, PA-C working with Glynn OctaveStephen Rancour, MD by Greggory StallionKayla Andersen, ED scribe. This patient was seen in room TR05C/TR05C and the patient's care was started at 6:55 PM.   Chief Complaint  Patient presents with  . Otalgia   The history is provided by the patient. No language interpreter was used.   HPI Comments: Bonnie Hansen is a 48 y.o. female who presents to the Emergency Department complaining of gradual onset left otalgia that radiates into her left jaw that started 2 months ago. States pain recently worsened so that's why she came in today. Opening her jaw worsens the pain. States she does not grind her teeth when she sleeps. Pt has taken tylenol with no relief. Denies fever, dental pain, rhinorrhea, hearing changes, tinnitus, cough. Denies history of diabetes.   History reviewed. No pertinent past medical history. History reviewed. No pertinent past surgical history. History reviewed. No pertinent family history. History  Substance Use Topics  . Smoking status: Never Smoker   . Smokeless tobacco: Not on file  . Alcohol Use: No   OB History   Grav Para Term Preterm Abortions TAB SAB Ect Mult Living                 Review of Systems  Constitutional: Negative for fever.  HENT: Positive for ear pain. Negative for dental problem, hearing loss, rhinorrhea and tinnitus.   Respiratory: Negative for cough.   All other systems reviewed and are negative.  Allergies  Review of patient's allergies indicates no known allergies.  Home Medications   Prior to Admission medications   Medication Sig Start Date End Date Taking? Authorizing Provider  acetaminophen (TYLENOL) 500 MG tablet Take 1,000 mg by mouth every 6 (six) hours as needed for pain.   Yes Historical Provider, MD  cetirizine (ZYRTEC) 10 MG tablet Take 10 mg by mouth daily as needed for allergies.    Yes  Historical Provider, MD  methocarbamol (ROBAXIN) 500 MG tablet Take 2 tablets (1,000 mg total) by mouth 4 (four) times daily as needed (Pain). 09/25/13   Tecora Eustache, PA-C   BP 110/56  Pulse 78  Temp(Src) 97.8 F (36.6 C) (Oral)  Resp 15  SpO2 99%  Physical Exam  Nursing note and vitals reviewed. Constitutional: She is oriented to person, place, and time. She appears well-developed and well-nourished. No distress.  HENT:  Head: Normocephalic.  Right Ear: Tympanic membrane, external ear and ear canal normal. No mastoid tenderness.  Left Ear: Tympanic membrane, external ear and ear canal normal. No mastoid tenderness.  Mouth/Throat: Oropharynx is clear and moist. No dental caries.  No obvious dental caries. Reduced ROM and tenderness to palpation at left TMJ. No clicking or crepitance the joint. No mastoid tenderness to palpation.  Generally good dentition, no gingival swelling, erythema or tenderness to palpation. Patient is handling their secretions. There is no tenderness to palpation or firmness underneath tongue bilaterally. No trismus.    Eyes: Conjunctivae and EOM are normal. Pupils are equal, round, and reactive to light.  Neck: Normal range of motion. Neck supple.  Cardiovascular: Normal rate.   Pulmonary/Chest: Effort normal. No stridor.  Musculoskeletal: Normal range of motion.  Lymphadenopathy:    She has no cervical adenopathy.  Neurological: She is alert and oriented to person, place, and time.  Psychiatric: She has a normal mood and affect.  ED Course  Procedures (including critical care time)  DIAGNOSTIC STUDIES: Oxygen Saturation is 100% on RA, normal by my interpretation.    COORDINATION OF CARE: 7:00 PM-Discussed treatment plan which includes robaxin and 800 mg ibuprofen with pt at bedside and pt agreed to plan.   Labs Review Labs Reviewed - No data to display  Imaging Review No results found.   EKG Interpretation None      MDM   Final  diagnoses:  TMJ (temporomandibular joint disorder)    Filed Vitals:   09/25/13 1753 09/25/13 1932  BP: 117/58 110/56  Pulse: 84 78  Temp: 97.8 F (36.6 C)   TempSrc: Oral   Resp: 18 15  SpO2: 100% 99%    Bonnie Hansen is a 48 y.o. female presenting with what she describes as left ear pain worsened with movement or when yawning over the course of 2 months. Patient has no signs of ear abnormalities. Patient has reduced range of motion in TMJ.  Evaluation does not show pathology that would require ongoing emergent intervention or inpatient treatment. Pt is hemodynamically stable and mentating appropriately. Discussed findings and plan with patient/guardian, who agrees with care plan. All questions answered. Return precautions discussed and outpatient follow up given.   Discharge Medication List as of 09/25/2013  7:13 PM    START taking these medications   Details  methocarbamol (ROBAXIN) 500 MG tablet Take 2 tablets (1,000 mg total) by mouth 4 (four) times daily as needed (Pain)., Starting 09/25/2013, Until Discontinued, Print           I personally performed the services described in this documentation, which was scribed in my presence. The recorded information has been reviewed and is accurate.  Wynetta Emeryicole Dore Oquin, PA-C 09/25/13 1938

## 2013-09-25 NOTE — Discharge Instructions (Signed)
Para el control del dolor puede tomar hasta 800 mg de Motrin (tambin conocido como el ibuprofeno ) . Eso es por lo general 4 sobre las pldoras de Medical illustratorcontador, 3 veces al C.H. Robinson Worldwideda . Tmelo con alimentos para minimizar la irritacin del estmago  Para el dolor irruptivo usted puede tomar Robaxin . No beba alcohol , conducir o manejar maquinaria pesada al tomar Robaxin .  No dudara en volver a la sala de urgencias para cualquier nuevo empeoramiento ni respecto de los sntomas.  For pain control you may take up to 800mg  of Motrin (also known as ibuprofen). That is usually 4 over the counter pills,  3 times a day. Take with food to minimize stomach irritation   For breakthrough pain you may take Robaxin. Do not drink alcohol, drive or operate heavy machinery when taking Robaxin.  Do not hesitate to return to the Emergency Department for any new, worsening or concerning symptoms.   If you do not have a primary care doctor you can establish one at the   [facility]: [address] [place] [id number] [phone]  After you establish care. Let them know you were seen in the emergency room. They must obtain records for further management.    Disfuncin de la articulacin temporomandibular (Temporomandibular Problems) Una disfuncin de la articulacin temporomandibular implica que existen problemas en la articulacin que se encuentra entre la Kittitasmandbula y el crneo. Esta articulacin est delimitada por un cartlago, como otras articulaciones del cuerpo, pero tambin tiene un pequeo disco que impide que los huesos se raspen uno con otro. Estas articulaciones son como cualquier otra y pueden inflamarse (irritarse) por artritis y Engineer, siteotros problemas. Cuando esta articulacin duele, tambin puede producir dolor de cabeza, Engineer, miningdolor en la mandbula y tambin en el rostro. CAUSAS  Generalmente los problemas de tipo artrtico son producidos por la inflamacin de Nurse, learning disabilityla articulacin. La inflamacin  tambin puede ser causada por el Loraneuso excesivo. Puede haberse ocasionado al Air Products and Chemicalsrechinar los dientes (bruxismo). Tambin puede producirse por la mala alineacin de Nurse, learning disabilityla articulacin. DIAGNSTICO Generalmente el diagnstico (determinar cul es el problema) se realiza a travs de los antecedentes y el examen fsico. El profesional le solicitar radiografas o una imagen por resonancia magntica (IRM) para determinar la causa exacta. Podr ser necesario que visite al dentista para determinar si los dientes y la mandbula estn bien alineados. TRATAMIENTO En la mayora de las veces no se trata de un problema grave. En algunos casos puede persistir (hacerse crnico). Cuando esto ocurre, los [place] Energymedicamentos que reducen la inflamacin (irritacin) pueden ser de Mill[place]gran ayuda. Tambin podr aliviarlo una inyeccin de corticoides. Si los dientes no estn alineados, el dentista lo solucionar con ortodoncia. Si no se encuentran causas fsicas, el origen puede estar en la tensin. Si sta es la causa, le sern de utilidad las tcnicas de biorretroalimentacin y Microbiologistrelajacin. INSTRUCCIONES PARA EL CUIDADO DOMICILIARIO  Puede aliviarlo la aplicacin de una bolsa con hielo. Coloque el hielo en una bolsa plstica y envulvala en una toalla para prevenir el congelamiento de la piel. Podr aplicarlo cada 2 horas durante 20  30 minutos, o cuando lo crea necesario, mientras se encuentre despierto, o segn se lo haya indicado el profesional que lo asiste.  Utilice los medicamentos de venta libre o de prescripcin para Chief Technology Officerel dolor, Environmental health practitionerel malestar o la Wolfdalefiebre, segn se lo indique el profesional que lo asiste.  Si le indican fisioterapia, siga las indicaciones de su mdico.  Si le indicaron aparatos de ortodoncia, selos segn  las indicaciones. Document Released: 12/16/2004 Document Revised: 05/31/2011 [facility] Patient Information 2015 [place], [place]. This information is not intended to replace advice given to you by your health care  provider. Make sure you discuss any questions you have with your health care provider.

## 2013-09-25 NOTE — ED Notes (Signed)
Present with left ear pain that radiates into left side of face and jaw. Yawning and movement make pain worse.

## 2013-09-26 NOTE — ED Provider Notes (Signed)
Medical screening examination/treatment/procedure(s) were performed by non-physician practitioner and as supervising physician I was immediately available for consultation/collaboration.   EKG Interpretation None        Glynn OctaveStephen Keian Odriscoll, MD 09/26/13 [phone]

## 2013-12-18 IMAGING — MG MM DIGITAL SCREENING BILAT W/ CAD
4 series · 4 of 4 positions shown · non-contrast
Comparison: Previous exam(s).

CLINICAL DATA: Screening.

DIGITAL SCREENING BILATERAL MAMMOGRAM WITH CAD

[L CC]
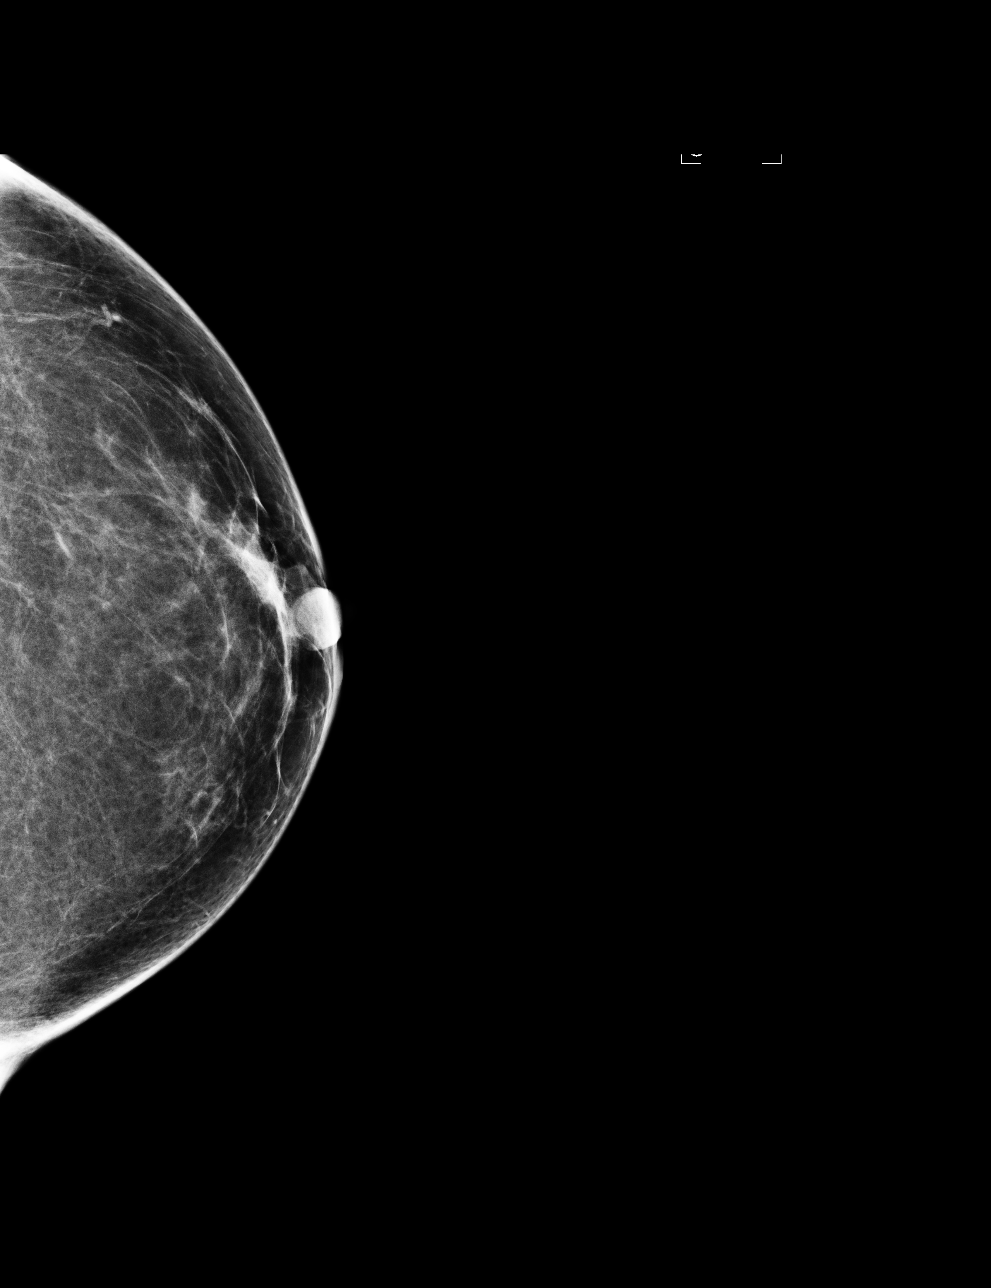

[L MLO]
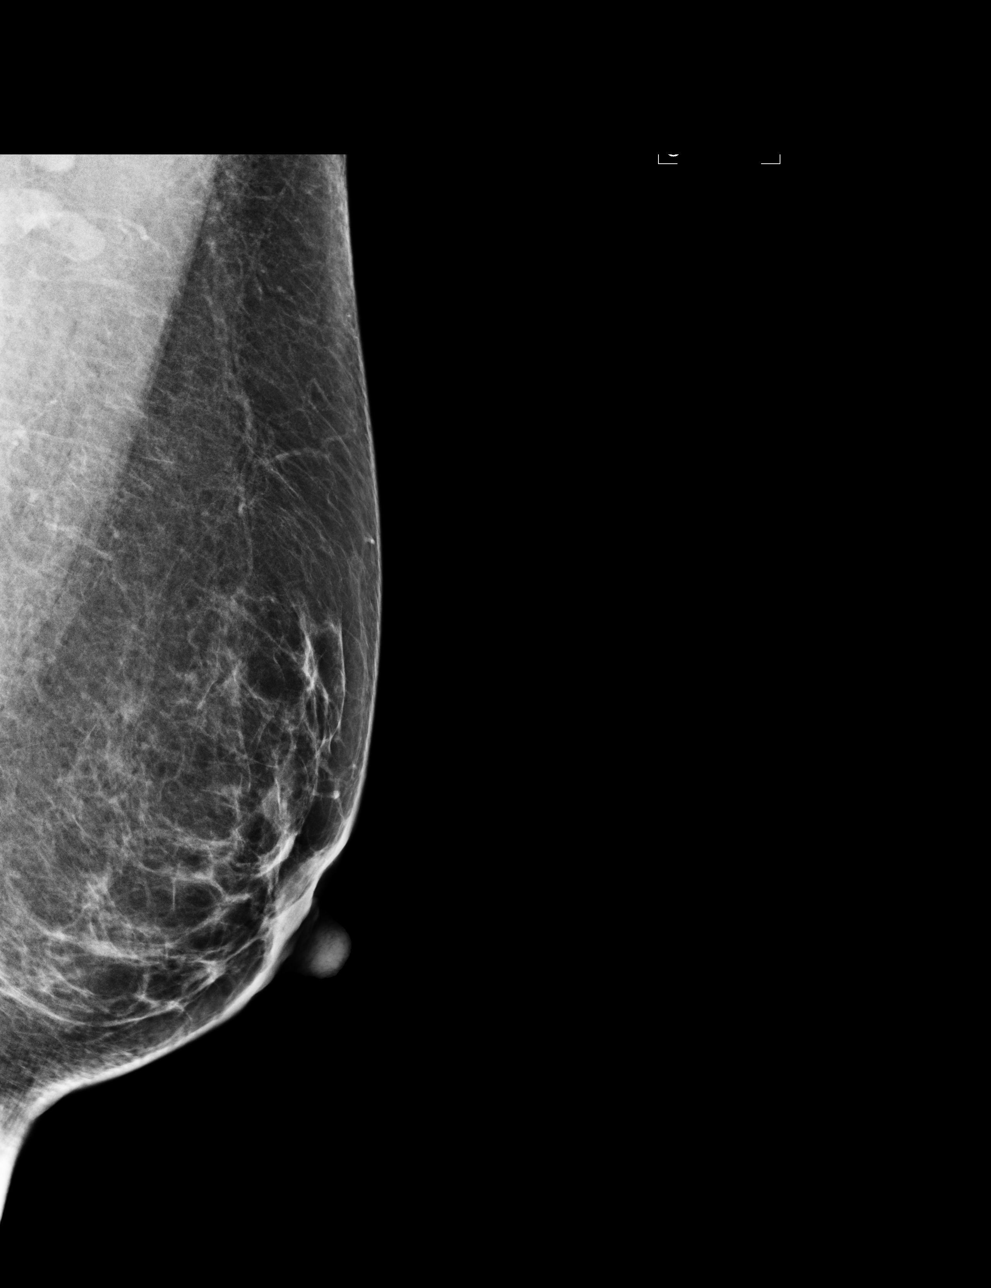

[R CC]
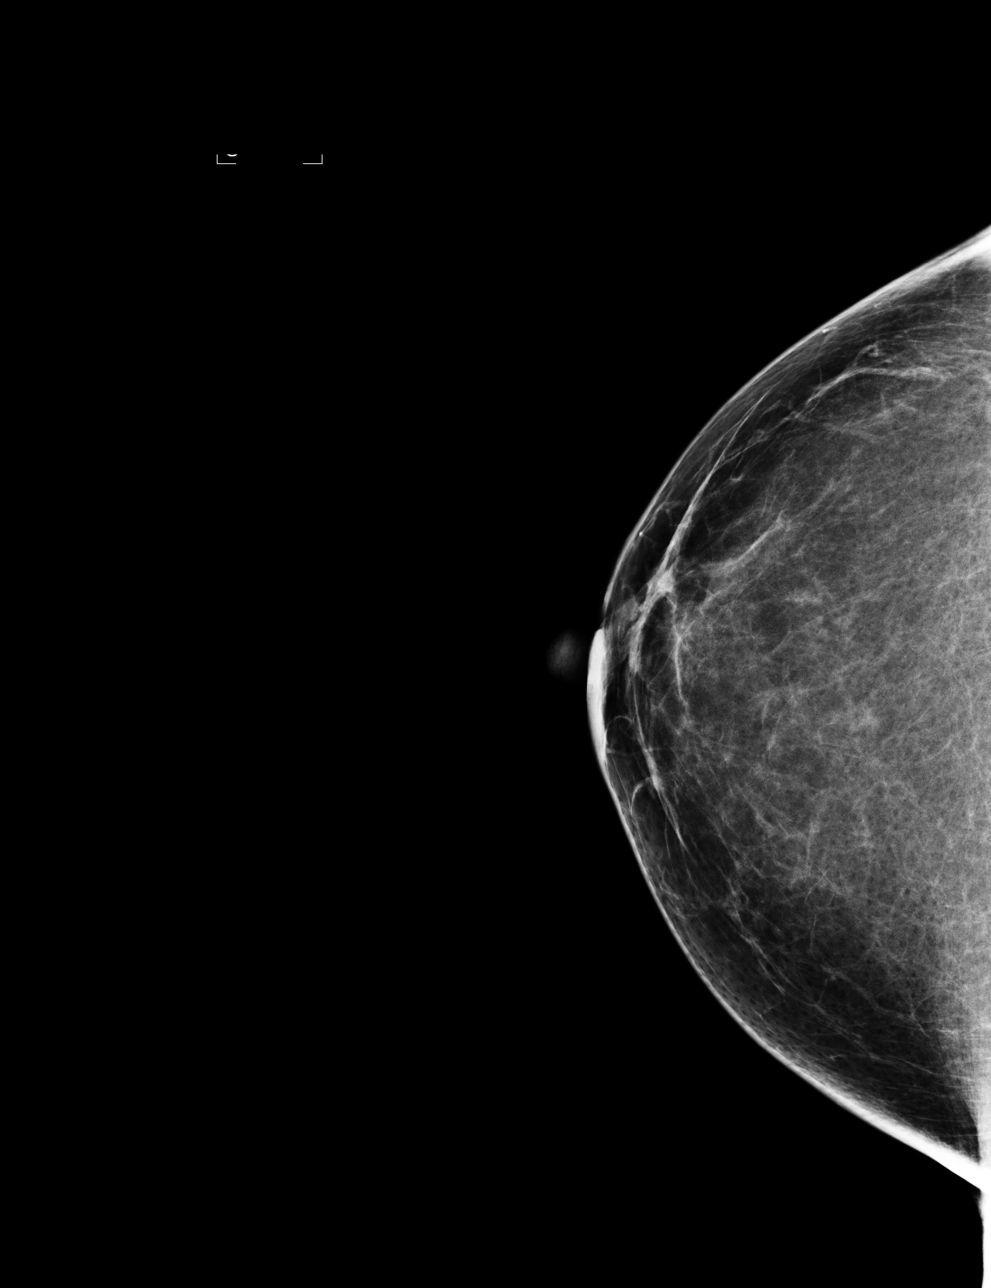

[R MLO]
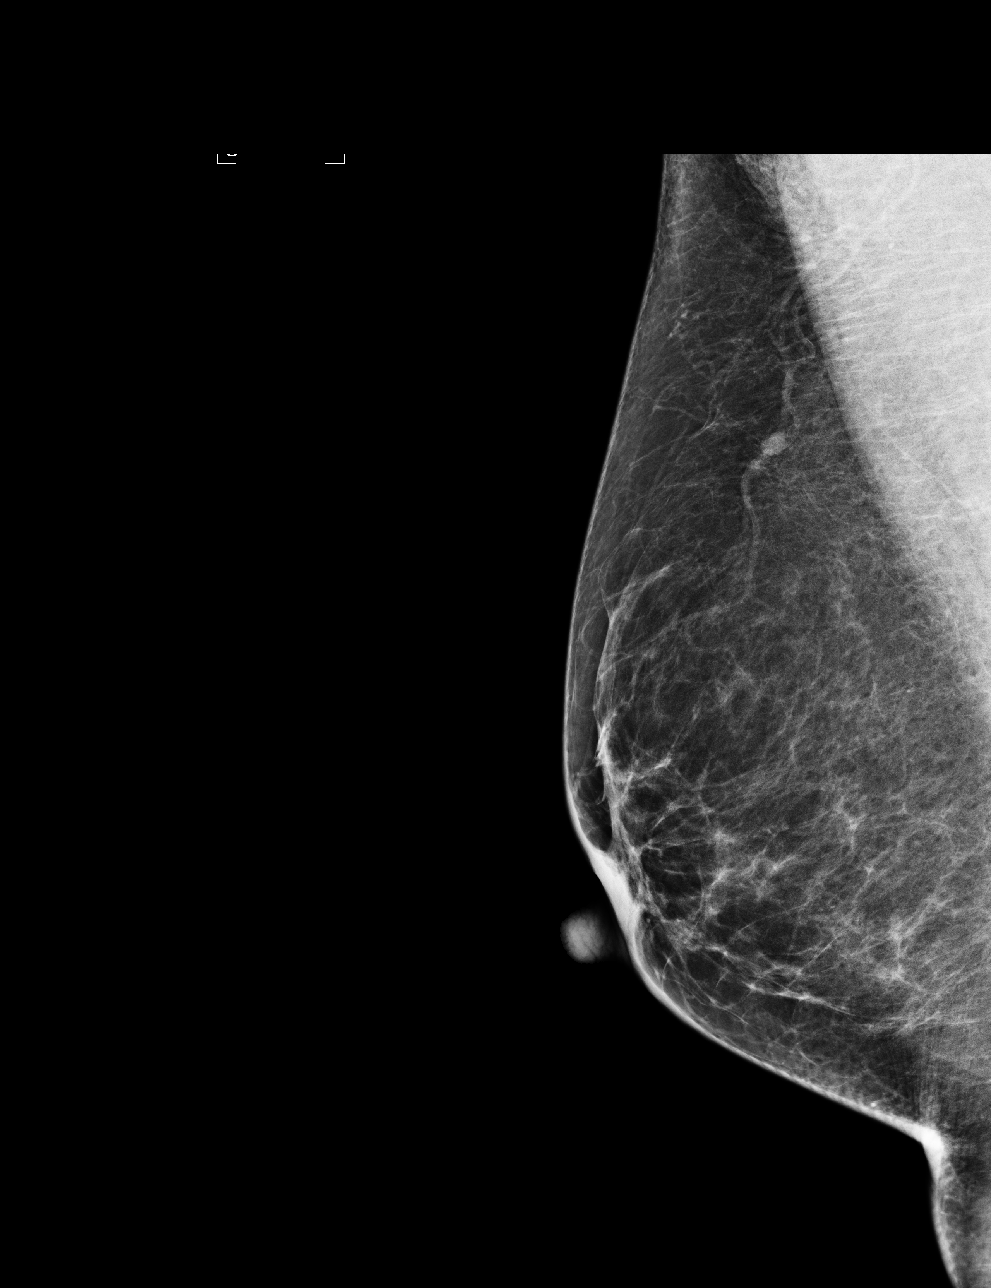

[4 of 4 positions shown; findings below may reference images not displayed]

FINDINGS: ACR Breast Density Category b:  There are scattered areas of
fibroglandular density.

There are no findings suspicious for malignancy.

Images were processed with CAD.
IMPRESSION: No mammographic evidence of malignancy.

A result letter of this screening mammogram will be mailed directly
to the patient.

RECOMMENDATION:
Screening mammogram in one year. (Code:RE-T-HFS)

BI-RADS CATEGORY 1:  Negative.

## 2015-05-08 ENCOUNTER — Encounter (HOSPITAL_COMMUNITY): Payer: Self-pay | Admitting: *Deleted

## 2015-05-08 DIAGNOSIS — R1110 Vomiting, unspecified: Secondary | ICD-10-CM | POA: Insufficient documentation

## 2015-05-08 DIAGNOSIS — R1013 Epigastric pain: Secondary | ICD-10-CM | POA: Insufficient documentation

## 2015-05-08 LAB — CBC
HCT: 37.1 % (ref 36.0–46.0)
Hemoglobin: 11.3 g/dL — ABNORMAL LOW (ref 12.0–15.0)
MCH: 23.7 pg — ABNORMAL LOW (ref 26.0–34.0)
MCHC: 30.5 g/dL (ref 30.0–36.0)
MCV: 77.9 fL — ABNORMAL LOW (ref 78.0–100.0)
PLATELETS: 268 10*3/uL (ref 150–400)
RBC: 4.76 MIL/uL (ref 3.87–5.11)
RDW: 17.4 % — ABNORMAL HIGH (ref 11.5–15.5)
WBC: 8.3 10*3/uL (ref 4.0–10.5)

## 2015-05-08 LAB — URINALYSIS, ROUTINE W REFLEX MICROSCOPIC
Bilirubin Urine: NEGATIVE
Glucose, UA: NEGATIVE mg/dL
HGB URINE DIPSTICK: NEGATIVE
Ketones, ur: NEGATIVE mg/dL
LEUKOCYTES UA: NEGATIVE
NITRITE: NEGATIVE
PROTEIN: NEGATIVE mg/dL
Specific Gravity, Urine: 1.013 (ref 1.005–1.030)
pH: 6 (ref 5.0–8.0)

## 2015-05-08 LAB — COMPREHENSIVE METABOLIC PANEL
ALK PHOS: 99 U/L (ref 38–126)
ALT: 22 U/L (ref 14–54)
ANION GAP: 11 (ref 5–15)
AST: 20 U/L (ref 15–41)
Albumin: 3.8 g/dL (ref 3.5–5.0)
BUN: 10 mg/dL (ref 6–20)
CALCIUM: 8.8 mg/dL — AB (ref 8.9–10.3)
CO2: 21 mmol/L — ABNORMAL LOW (ref 22–32)
CREATININE: 0.49 mg/dL (ref 0.44–1.00)
Chloride: 106 mmol/L (ref 101–111)
Glucose, Bld: 106 mg/dL — ABNORMAL HIGH (ref 65–99)
Potassium: 4 mmol/L (ref 3.5–5.1)
Sodium: 138 mmol/L (ref 135–145)
TOTAL PROTEIN: 7 g/dL (ref 6.5–8.1)
Total Bilirubin: 0.3 mg/dL (ref 0.3–1.2)

## 2015-05-08 LAB — LIPASE, BLOOD: LIPASE: 43 U/L (ref 11–51)

## 2015-05-08 NOTE — ED Notes (Signed)
Patient reports 2 day history of upper abdominal pain with n/v/d. No fever.

## 2015-05-09 ENCOUNTER — Emergency Department (HOSPITAL_COMMUNITY)
Admission: EM | Admit: 2015-05-09 | Discharge: 2015-05-09 | Disposition: A | Discharge status: Home / Self Care | Payer: Self-pay | Attending: Emergency Medicine | Admitting: Emergency Medicine

## 2015-05-09 DIAGNOSIS — R1013 Epigastric pain: Secondary | ICD-10-CM

## 2015-05-09 MED ORDER — METOCLOPRAMIDE HCL 10 MG PO TABS
10.0000 mg | ORAL_TABLET | Freq: Once | ORAL | Status: AC
  Administered 2015-05-09: 10 mg via ORAL
  Filled 2015-05-09: qty 1

## 2015-05-09 MED ORDER — SUCRALFATE 1 GM/10ML PO SUSP
1.0000 g | Freq: Once | ORAL | Status: AC
  Administered 2015-05-09: 1 g via ORAL
  Filled 2015-05-09: qty 10

## 2015-05-09 MED ORDER — RANITIDINE HCL 150 MG/10ML PO SYRP
300.0000 mg | ORAL_SOLUTION | Freq: Once | ORAL | Status: AC
  Administered 2015-05-09: 300 mg via ORAL
  Filled 2015-05-09: qty 20

## 2015-05-09 NOTE — ED Provider Notes (Signed)
CSN: [id number]     Arrival date & time 05/08/15  2001 History  By signing my name below, I, Bonnie Hansen, attest that this documentation has been prepared under the direction and in the presence of Tomasita Crumble, MD. Electronically Signed: Octavia Heir, ED Scribe. 05/09/2015. 1:01 AM.    Chief Complaint  Patient presents with  . Abdominal Pain  . Emesis      The history is provided by the patient. No language interpreter was used.   HPI Comments: Bonnie Hansen is a 50 y.o. female who presents to the Emergency Department complaining of constant, gradual worsening, moderate, cramping, mid-abdominal pain with associated vomiting, nausea, diarrhea, and headache onset two days ago. She has a hx of cholecystectomy in the past few years. There are no aggravating for modifying factors noted. She has not taken any medication to alleviate the pain.  Denies fever, hematuria, dysuria, vaginal bleeding,  History reviewed. No pertinent past medical history. History reviewed. No pertinent past surgical history. History reviewed. No pertinent family history. Social History  Substance Use Topics  . Smoking status: Never Smoker   . Smokeless tobacco: None  . Alcohol Use: No   OB History    No data available     Review of Systems  A complete 10 system review of systems was obtained and all systems are negative except as noted in the HPI and PMH.    Allergies  Review of patient's allergies indicates no known allergies.  Home Medications   Prior to Admission medications   Medication Sig Start Date End Date Taking? Authorizing Provider  acetaminophen (TYLENOL) 500 MG tablet Take 1,000 mg by mouth every 6 (six) hours as needed for pain.    Historical Provider, MD  cetirizine (ZYRTEC) 10 MG tablet Take 10 mg by mouth daily as needed for allergies.     Historical Provider, MD  methocarbamol (ROBAXIN) 500 MG tablet Take 2 tablets (1,000 mg total) by mouth 4 (four) times daily as needed (Pain).  09/25/13   Nicole Pisciotta, PA-C   Triage vitals: BP 123/65 mmHg  Pulse 68  Temp(Src) 98.3 F (36.8 C) (Oral)  Resp 16  SpO2 99% Physical Exam  Constitutional: She is oriented to person, place, and time. She appears well-developed and well-nourished. No distress.  HENT:  Head: Normocephalic and atraumatic.  Nose: Nose normal.  Mouth/Throat: Oropharynx is clear and moist. No oropharyngeal exudate.  Eyes: Conjunctivae and EOM are normal. Pupils are equal, round, and reactive to light. No scleral icterus.  Neck: Normal range of motion. Neck supple. No JVD present. No tracheal deviation present. No thyromegaly present.  Cardiovascular: Normal rate, regular rhythm and normal heart sounds.  Exam reveals no gallop and no friction rub.   No murmur heard. Pulmonary/Chest: Effort normal and breath sounds normal. No respiratory distress. She has no wheezes. She exhibits no tenderness.  Abdominal: Soft. Bowel sounds are normal. She exhibits no distension and no mass. There is tenderness. There is no rebound and no guarding.  Mid epigastric pain tenderness to palpation  Musculoskeletal: Normal range of motion. She exhibits no edema or tenderness.  Lymphadenopathy:    She has no cervical adenopathy.  Neurological: She is alert and oriented to person, place, and time. No cranial nerve deficit. She exhibits normal muscle tone.  Skin: Skin is warm and dry. No rash noted. No erythema. No pallor.  Nursing note and vitals reviewed.   ED Course  Procedures  DIAGNOSTIC STUDIES: Oxygen Saturation is 99% on RA,  normal by my interpretation.  COORDINATION OF CARE:  12:56 AM Discussed treatment plan which includes pain medication with pt at bedside and pt agreed to plan.  Labs Review Labs Reviewed  COMPREHENSIVE METABOLIC PANEL - Abnormal; Notable for the following:    CO2 21 (*)    Glucose, Bld 106 (*)    Calcium 8.8 (*)    All other components within normal limits  CBC - Abnormal; Notable for the  following:    Hemoglobin 11.3 (*)    MCV 77.9 (*)    MCH 23.7 (*)    RDW 17.4 (*)    All other components within normal limits  LIPASE, BLOOD  URINALYSIS, ROUTINE W REFLEX MICROSCOPIC (NOT AT [facility])    Imaging Review No results found. I have personally reviewed and evaluated these images and lab results as part of my medical decision-making.   EKG Interpretation None      MDM   Final diagnoses:  None    Patient presents to the ED for epigastric abdominal pain.  She was given reglan, ranitidine and sucralfate for treatment.  Labs are unremarkable.    Upon repeat evaluation, she states her pain has completely resolved.  She was advised to continue ranitidine at home and see a primary care doctor within 3 days.  She demonstrates good understanding.  She appears well and in NAD.  VS remain within her normal limits and she is safe for DC.  I personally performed the services described in this documentation, which was scribed in my presence. The recorded information has been reviewed and is accurate.     Tomasita Crumble, MD 05/09/15 [phone]

## 2015-05-09 NOTE — Discharge Instructions (Signed)
Dolor abdominal en adultos (Abdominal Pain, Adult) Ms. Ehrler, your blood work today was normal.  Take ranitidine at home for pain and see a primary care doctor within 3 days for close follow up.  If any symptoms worsen, come back to the ED immediately. Thank you.   El dolor de [place] (abdominal) puede tener muchas causas. La mayora de las veces, el dolor de [place] no es peligroso. Muchos de [employer] de dolor de estmago pueden controlarse y tratarse en casa. CUIDADOS EN EL HOGAR   No tome medicamentos que lo ayuden a defecar (laxantes), salvo que su mdico se lo indique.  Solo tome los medicamentos que le haya indicado su mdico.  Coma o beba lo que le indique su mdico. Su mdico le dir si debe seguir una dieta especial. SOLICITE AYUDA SI:  No sabe cul es la causa del dolor de [place].  Tiene dolor de estmago cuando siente ganas de vomitar (nuseas) o tiene colitis (diarrea).  Tiene dolor durante la miccin o la evacuacin.  El dolor de estmago lo despierta de noche.  Tiene dolor de [employer] empeora o [place] cuando come.  Tiene dolor de [employer] empeora cuando come [employer].  Tiene fiebre. SOLICITE AYUDA DE INMEDIATO SI:   El dolor no desaparece en un plazo mximo de 2horas.  No deja de (vomitar).  El dolor cambia y se Librarian, academic solo en la parte derecha o izquierda del [place].  La materia fecal es sanguinolenta o de aspecto alquitranado. ASEGRESE DE QUE:   Comprende estas instrucciones.  Controlar su afeccin.  Recibir ayuda de inmediato si no mejora o si empeora.   Esta informacin no tiene Theme park manager el consejo del mdico. Asegrese de hacerle al mdico cualquier pregunta que tenga.   Document Released: 06/04/2008 Document Revised: 03/29/2014 Elsevier Interactive Patient Education [employer]
